# Patient Record
Sex: Female | Born: 2000 | Race: White | Hispanic: No | Marital: Single | Smoking: Never smoker
Health system: Southern US, Community
[De-identification: ages and names within clinical notes are randomized; demographics above are authoritative.]

## PROBLEM LIST (undated history)

## (undated) DIAGNOSIS — N915 Oligomenorrhea, unspecified: Secondary | ICD-10-CM

## (undated) DIAGNOSIS — J45909 Unspecified asthma, uncomplicated: Secondary | ICD-10-CM

## (undated) DIAGNOSIS — G43909 Migraine, unspecified, not intractable, without status migrainosus: Secondary | ICD-10-CM

## (undated) DIAGNOSIS — E669 Obesity, unspecified: Secondary | ICD-10-CM

## (undated) DIAGNOSIS — E282 Polycystic ovarian syndrome: Secondary | ICD-10-CM

## (undated) DIAGNOSIS — J302 Other seasonal allergic rhinitis: Secondary | ICD-10-CM

## (undated) DIAGNOSIS — M249 Joint derangement, unspecified: Secondary | ICD-10-CM

## (undated) HISTORY — DX: Migraine, unspecified, not intractable, without status migrainosus: G43.909

## (undated) HISTORY — DX: Joint derangement, unspecified: M24.9

## (undated) HISTORY — DX: Other seasonal allergic rhinitis: J30.2

## (undated) HISTORY — DX: Oligomenorrhea, unspecified: N91.5

## (undated) HISTORY — DX: Unspecified asthma, uncomplicated: J45.909

## (undated) HISTORY — DX: Obesity, unspecified: E66.9

---
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
## 2018-07-16 ENCOUNTER — Encounter (HOSPITAL_COMMUNITY): Payer: Self-pay

## 2018-11-02 ENCOUNTER — Other Ambulatory Visit: Payer: Self-pay

## 2018-12-13 ENCOUNTER — Other Ambulatory Visit: Payer: Self-pay

## 2018-12-13 ENCOUNTER — Ambulatory Visit (HOSPITAL_COMMUNITY)
Admission: EM | Admit: 2018-12-13 | Discharge: 2018-12-13 | Disposition: A | Discharge status: Home / Self Care | Payer: Medicaid Other | Attending: Family Medicine | Admitting: Family Medicine

## 2018-12-13 ENCOUNTER — Encounter (HOSPITAL_COMMUNITY): Payer: Self-pay | Admitting: Emergency Medicine

## 2018-12-13 DIAGNOSIS — M545 Low back pain, unspecified: Secondary | ICD-10-CM

## 2018-12-13 MED ORDER — CYCLOBENZAPRINE HCL 5 MG PO TABS: 5.0000 mg | ORAL_TABLET | Freq: Two times a day (BID) | ORAL | 0 refills | Status: AC | PRN

## 2018-12-13 MED ORDER — IBUPROFEN 800 MG PO TABS: 800.0000 mg | ORAL_TABLET | Freq: Three times a day (TID) | ORAL | 0 refills | Status: DC

## 2018-12-13 NOTE — ED Triage Notes (Signed)
Patient was using stair/chair today and felt right hip "tear".  Patient was using equipment going up stairs.

## 2018-12-13 NOTE — Discharge Instructions (Signed)
Use anti-inflammatories for pain/swelling. You may take up to 800 mg Ibuprofen every 8 hours with food. You may supplement Ibuprofen with Tylenol [phone] mg every 8 hours.   You may use flexeril as needed to help with pain. This is a muscle relaxer and causes sedation- please use only at bedtime or when you will be home and not have to drive/work  Alternate ice and heat  Gentle stretching- see attached  Follow up if not resolving or worsening

## 2018-12-15 NOTE — ED Provider Notes (Signed)
[facility]    CSN: [id number] Arrival date & time: 12/13/18  1834      History   Chief Complaint Chief Complaint  Patient presents with  . Hip Pain    HPI Rachel Sparks is a 18 y.o. female history of asthma, migraines, presenting today for evaluation of back/hip pain.  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  States that there was a lot of weight on her and and she was pulling the chair.  At one point she felt a pull and heard low back/gluteal pain that wrapped around to her hip.  Since she has had a lot of pain.  This accident happened earlier this morning.  She has not taken any medicines.  She has not had any radiation of pain, but did have a brief sensation of numbness in her foot.  She denies issues with urination or bowel movements.  Denies saddle anesthesia.  Denies any fall or direct blow to this area.  HPI  Past Medical History:  Diagnosis Date  . Asthma   . Hypermobile joints   . Migraines   . Obesity   . Oligomenorrhea   . Seasonal allergies     Patient Active Problem List   Diagnosis Date Noted  . Chest pain 09/19/2014  . Migraine headache 09/19/2014  . Asthma, chronic 09/19/2014  . Idiopathic scoliosis 09/19/2014  . 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 Anovulatory (dysfunctional uterine) bleeding 08/24/2012  . Obesity     Past Surgical History:  Procedure Laterality Date  . APPENDECTOMY  1/12    OB History   No obstetric history on file.      Home Medications    Prior to Admission medications   Medication Sig Start Date End Date Taking? Authorizing Provider  DULoxetine (CYMBALTA) 60 MG capsule Take 60 mg by mouth 2 (two) times daily.   Yes [provider]  medroxyPROGESTERone (DEPO-PROVERA) 150 MG/ML injection Inject 150 mg into the muscle every 3 (three) months.   Yes [provider]  montelukast (SINGULAIR) 10 MG tablet Take 10 mg by mouth at bedtime.  06/26/14  Yes  [provider]  nabumetone (RELAFEN) 750 MG tablet Take 750 mg by mouth 2 (two) times daily. 04/27/14  Yes [provider]  pantoprazole (PROTONIX) 40 MG tablet Take 40 mg by mouth daily. 05/08/17  Yes [provider]  topiramate (TOPAMAX) 100 MG tablet Take 150 mg by mouth at bedtime. 07/03/17  Yes [provider]  vitamin C (ASCORBIC ACID) 500 MG tablet Take 500 mg by mouth at bedtime.   Yes [provider]  cetirizine (ZYRTEC) 10 MG tablet Take 10 mg by mouth at bedtime.     [provider]  cyclobenzaprine (FLEXERIL) 5 MG tablet Take 1-2 tablets (5-10 mg total) by mouth 2 (two) times daily as needed for muscle spasms. 12/13/18   Kerina Simoneau C, PA-C  ibuprofen (ADVIL) 800 MG tablet Take 1 tablet (800 mg total) by mouth 3 (three) times daily. 12/13/18   Kadience Macchi C, PA-C  oxyCODONE-acetaminophen (PERCOCET) 5-325 MG tablet Take 1 tablet by mouth every 4 (four) hours as needed. 08/15/17   Julianne Rice, MD    Family History Family History  Problem Relation Age of Onset  . Hypertension Mother   . Obesity Mother   . Diabetes Mother   . Multiple sclerosis Mother   . Hypertension Maternal Grandfather   . Diabetes Maternal Grandfather   .  COPD Maternal Grandfather   . Diabetes Paternal Grandmother   . Hypertension Paternal Grandmother   . Asthma Other        great uncle  . Rheumatologic disease Neg Hx     Social History Social History   Tobacco Use  . Smoking status: Never Smoker  . Smokeless tobacco: Never Used  Substance Use Topics  . Alcohol use: No    Alcohol/week: 0.0 standard drinks  . Drug use: No     Allergies   Patient has no known allergies.   Review of Systems Review of Systems  Constitutional: Negative for fatigue and fever.  Eyes: Negative for visual disturbance.  Respiratory: Negative for shortness of breath.   Cardiovascular: Negative for chest pain.  Gastrointestinal: Negative for abdominal  pain, nausea and vomiting.  Genitourinary: Negative for decreased urine volume and difficulty urinating.  Musculoskeletal: Positive for back pain, gait problem and myalgias. Negative for arthralgias and joint swelling.  Skin: Negative for color change, rash and wound.  Neurological: Negative for dizziness, weakness, light-headedness and headaches.     Physical Exam Triage Vital Signs ED Triage Vitals  Enc Vitals Group     BP 12/13/18 1948 114/71     Pulse Rate 12/13/18 1948 85     Resp 12/13/18 1948 20     Temp 12/13/18 1948 98.7 F (37.1 C)     Temp Source 12/13/18 1948 Oral     SpO2 12/13/18 1948 100 %     Weight --      Height --      Head Circumference --      Peak Flow --      Pain Score 12/13/18 1943 8     Pain Loc --      Pain Edu? --      Excl. in GC? --    No data found.  Updated Vital Signs BP 114/71 (BP Location: Right Arm) Comment (BP Location): large cuff  Pulse 85   Temp 98.7 F (37.1 C) (Oral)   Resp 20   SpO2 100%   Visual Acuity Right Eye Distance:   Left Eye Distance:   Bilateral Distance:    Right Eye Near:   Left Eye Near:    Bilateral Near:     Physical Exam Vitals signs and nursing note reviewed.  Constitutional:      Appearance: She is well-developed.     Comments: No acute distress  HENT:     Head: Normocephalic and atraumatic.     Nose: Nose normal.  Eyes:     Conjunctiva/sclera: Conjunctivae normal.  Neck:     Musculoskeletal: Neck supple.  Cardiovascular:     Rate and Rhythm: Normal rate.  Pulmonary:     Effort: Pulmonary effort is normal. No respiratory distress.  Abdominal:     General: There is no distension.  Musculoskeletal: Normal range of motion.     Comments: Nontender to palpation of cervical, thoracic and lumbar spine midline, no palpable deformity or step-off, tenderness to palpation to lower lumbar/sacral musculature on right side wrapping around to right hip and down lateral proximal thigh, pain does not extend  into groin  Hip strength 5/5 and equal bilaterally, knee strength 5/5 and equal bilaterally, patellar reflex 2+ bilaterally  Changes position easily, gait with minimal antalgia  Skin:    General: Skin is warm and dry.  Neurological:     Mental Status: She is alert and oriented to person, place, and time.  UC Treatments / Results  Labs (all labs ordered are listed, but only abnormal results are displayed) Labs Reviewed - No data to display  EKG   Radiology No results found.  Procedures Procedures (including critical care time)  Medications Ordered in UC Medications - No data to display  Initial Impression / Assessment and Plan / UC Course  I have reviewed the triage vital signs and the nursing notes.  Pertinent labs & imaging results that were available during my care of the patient were reviewed by me and considered in my medical decision making (see chart for details).     Given mechanism of injury, most likely muscle strain of lower lumbar region, do not suspect acute bony abnormality.  Cannot rule out any tendon/ligamentous tear, but feel most likely muscle strain is most likely cause given heavy lifting.  No neuro deficits, no red flags for cauda equina.  Strength intact.  Will treat with anti-inflammatories and muscle relaxers and continue to monitor.  If symptoms not resolving or worsening for the next 1 to 2 weeks please follow-up.Discussed strict return precautions. Patient verbalized understanding and is agreeable with plan.  Final Clinical Impressions(s) / UC Diagnoses   Final diagnoses:  Acute right-sided low back pain without sciatica     Discharge Instructions     Use anti-inflammatories for pain/swelling. You may take up to 800 mg Ibuprofen every 8 hours with food. You may supplement Ibuprofen with Tylenol [phone] mg every 8 hours.   You may use flexeril as needed to help with pain. This is a muscle relaxer and causes sedation- please use only at  bedtime or when you will be home and not have to drive/work  Alternate ice and heat  Gentle stretching- see attached  Follow up if not resolving or worsening   ED Prescriptions    Medication Sig Dispense Auth. Provider   ibuprofen (ADVIL) 800 MG tablet Take 1 tablet (800 mg total) by mouth 3 (three) times daily. 21 tablet Fusaye Wachtel C, PA-C   cyclobenzaprine (FLEXERIL) 5 MG tablet Take 1-2 tablets (5-10 mg total) by mouth 2 (two) times daily as needed for muscle spasms. 24 tablet Lynn Recendiz, [place] C, PA-C     PDMP not reviewed this encounter.   Lew Dawes, PA-C 12/15/18 1524

## 2019-06-26 IMAGING — DX DG CHEST 1V PORT
1 series · 1 of 1 positions shown · non-contrast
Comparison: Chest CT 09/25/2014

CLINICAL DATA: Fall from horse.

EXAM:
PORTABLE CHEST 1 VIEW

[chest ap]
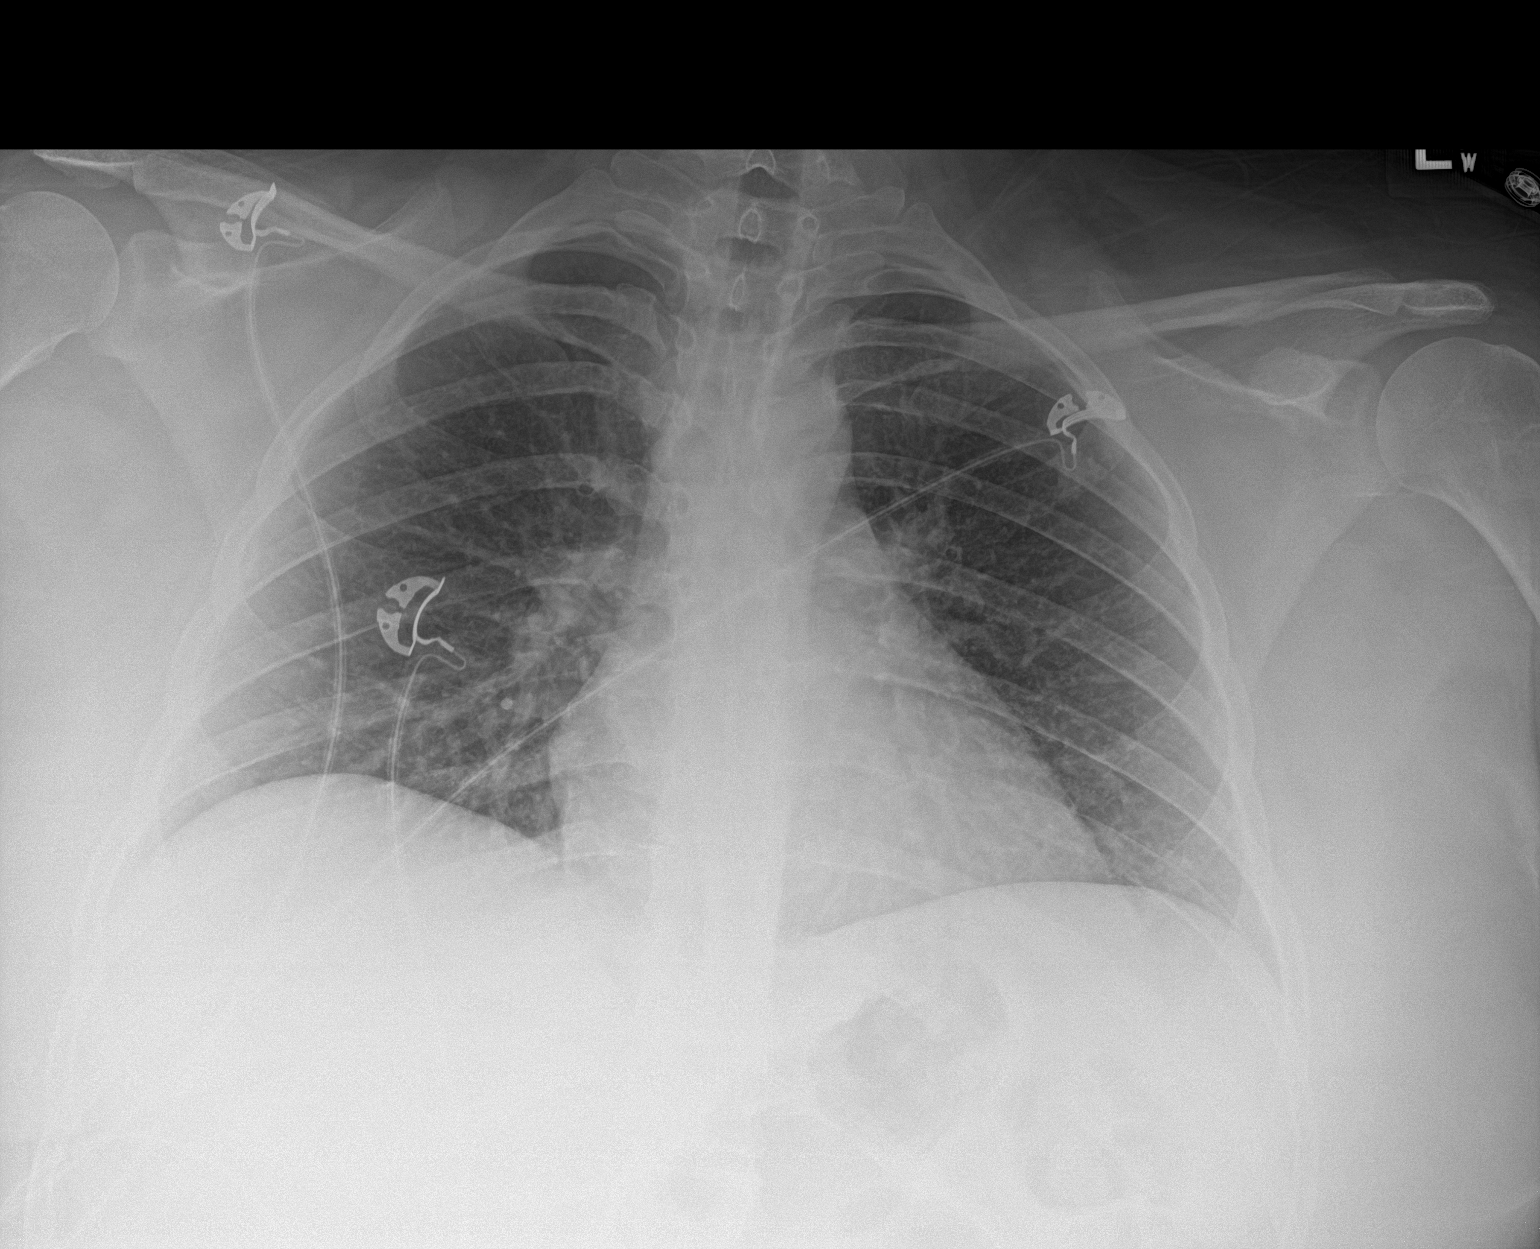

[1 of 1 positions shown; findings below may reference images not displayed]

FINDINGS: The heart size and mediastinal contours are within normal limits.
Both lungs are clear. The visualized skeletal structures are
unremarkable.
IMPRESSION: No active disease.

## 2019-06-26 IMAGING — DX DG FEMUR 2+V PORT*R*
4 series · 4 of 4 positions shown · non-contrast
Comparison: None.

CLINICAL DATA: Right hip pain.  Patient was bucked from a horse.

EXAM:
RIGHT FEMUR PORTABLE 2 VIEW

[femur ap (1 of 2)]
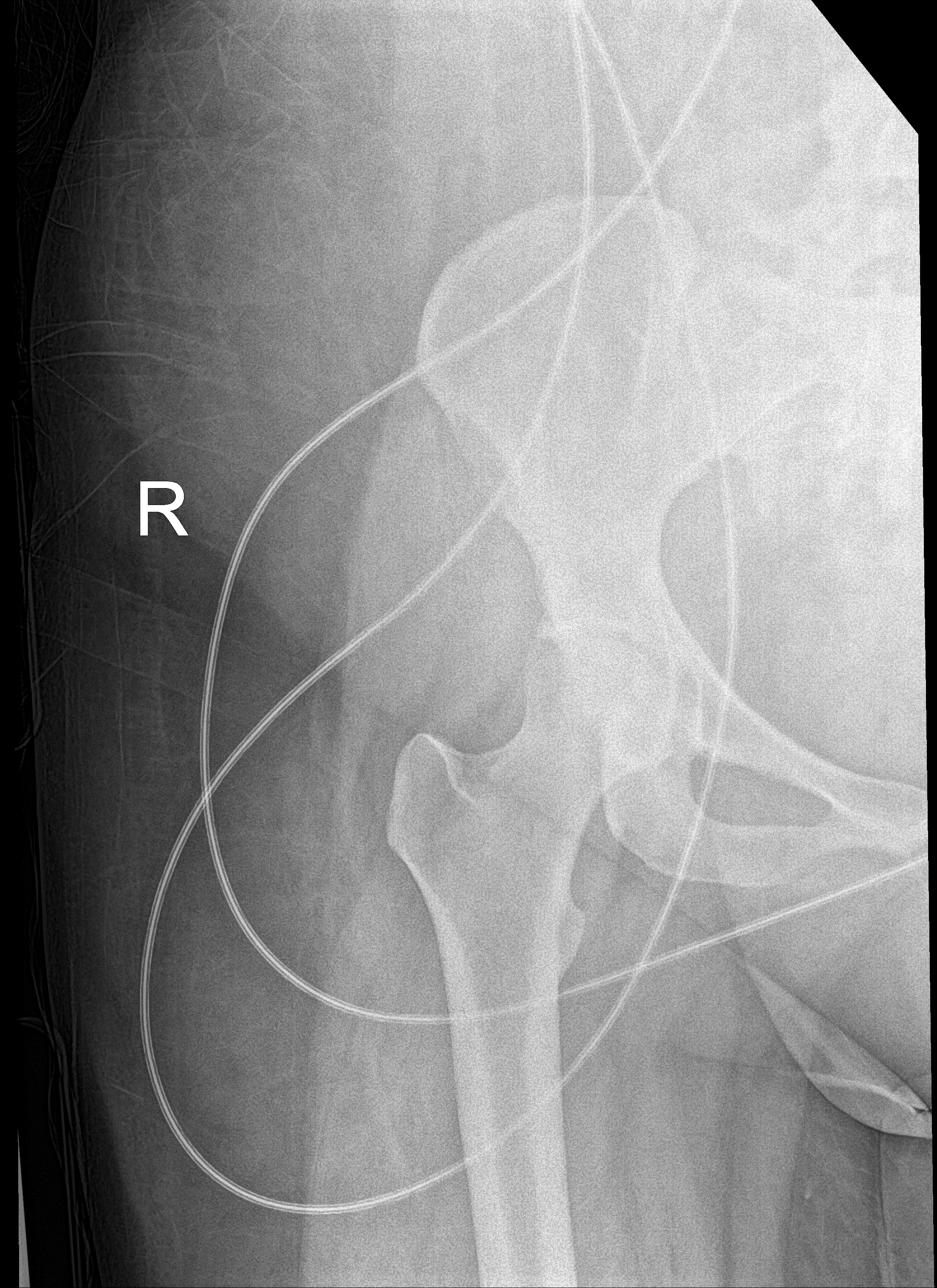

[femur ap (2 of 2)]
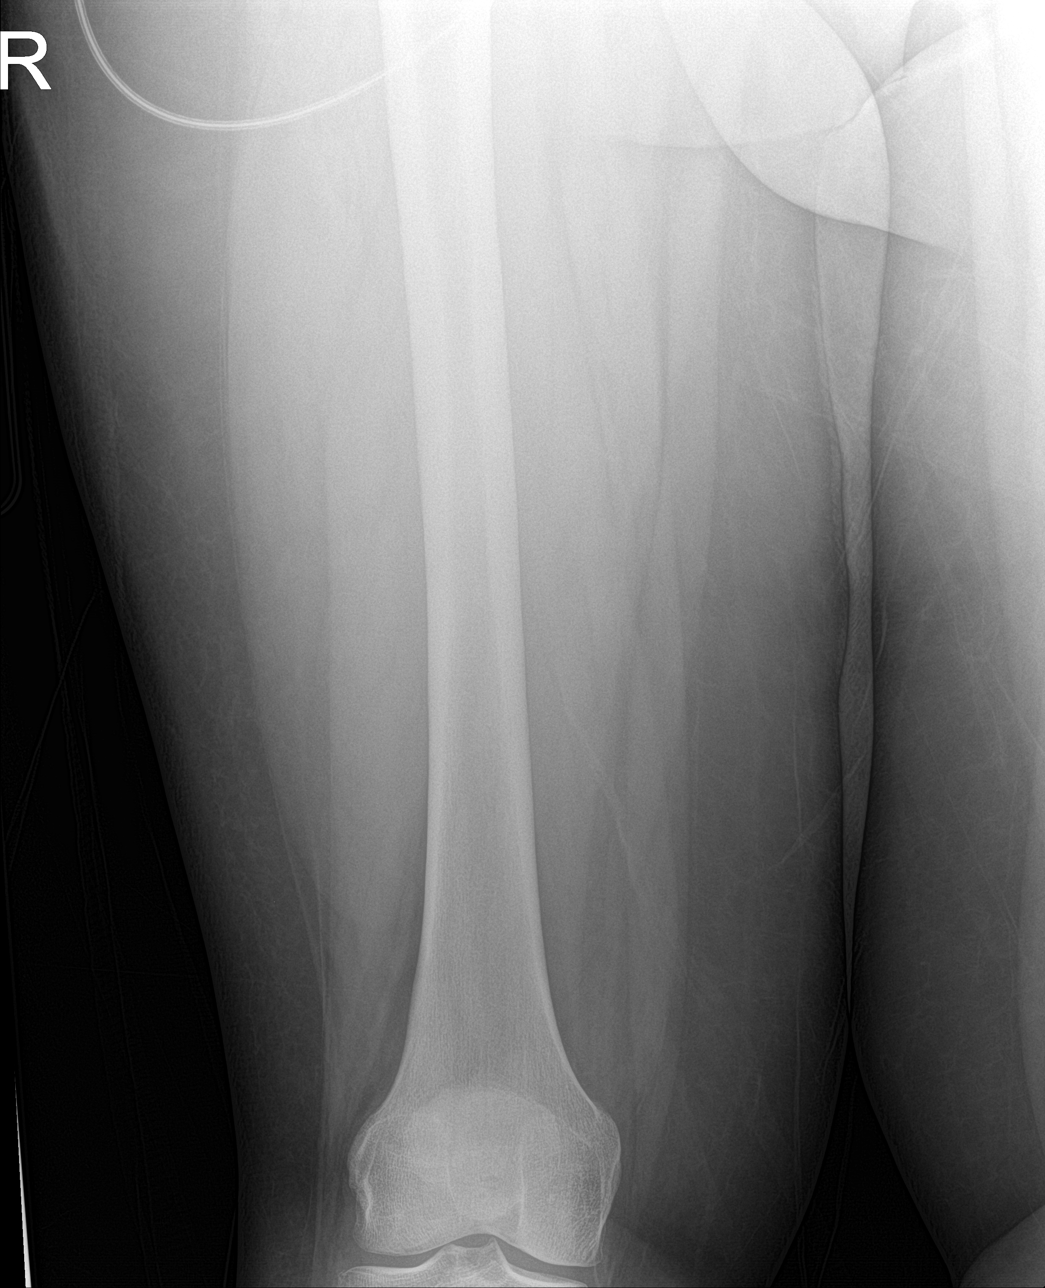

[femur lat (1 of 2)]
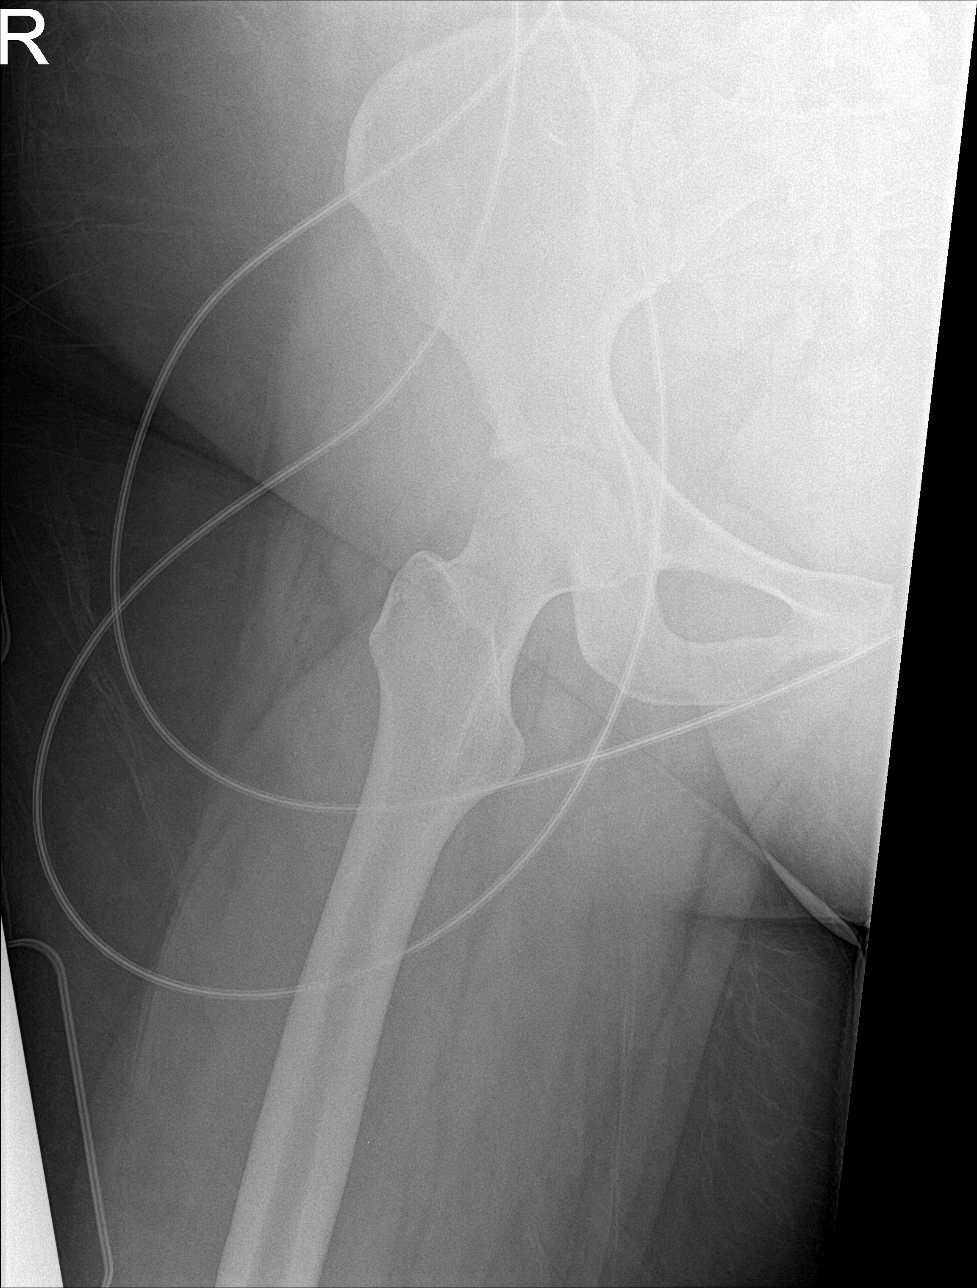

[femur lat (2 of 2)]
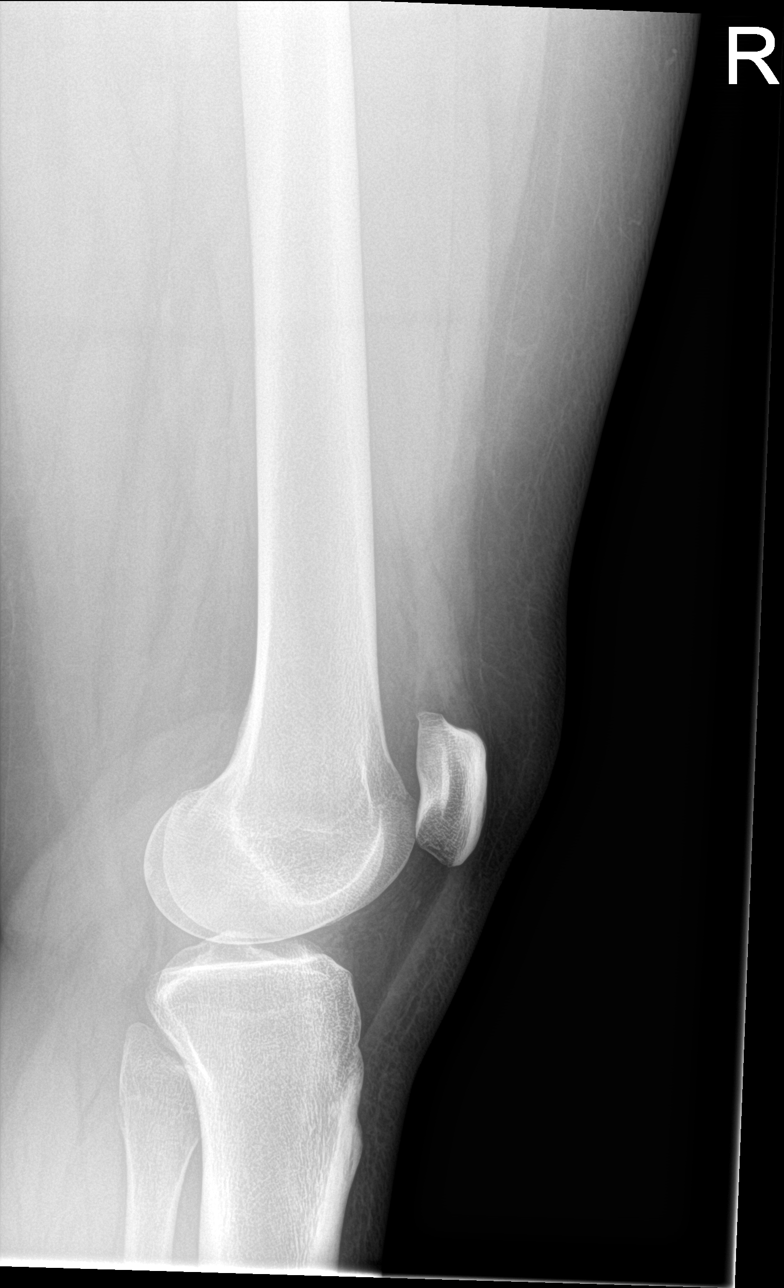

[4 of 4 positions shown; findings below may reference images not displayed]

FINDINGS: The AP view of the right hip is mildly limited by body habitus. The
mineralization and alignment are normal. There is no evidence of
acute fracture or dislocation. No evidence of femoral head avascular
necrosis. The hip and knee joint spaces appear maintained.
IMPRESSION: No evidence of acute right thigh injury.

## 2020-07-24 ENCOUNTER — Encounter (HOSPITAL_COMMUNITY): Payer: Self-pay | Admitting: Obstetrics & Gynecology

## 2020-07-24 ENCOUNTER — Other Ambulatory Visit: Payer: Self-pay

## 2020-07-24 ENCOUNTER — Inpatient Hospital Stay (HOSPITAL_COMMUNITY)
Admission: EM | Admit: 2020-07-24 | Discharge: 2020-07-24 | Disposition: A | Discharge status: Home / Self Care | Payer: BC Managed Care – PPO | Attending: Obstetrics & Gynecology | Admitting: Obstetrics & Gynecology

## 2020-07-24 DIAGNOSIS — O219 Vomiting of pregnancy, unspecified: Secondary | ICD-10-CM | POA: Diagnosis present

## 2020-07-24 DIAGNOSIS — E282 Polycystic ovarian syndrome: Secondary | ICD-10-CM | POA: Diagnosis not present

## 2020-07-24 DIAGNOSIS — O210 Mild hyperemesis gravidarum: Secondary | ICD-10-CM | POA: Diagnosis not present

## 2020-07-24 DIAGNOSIS — O99281 Endocrine, nutritional and metabolic diseases complicating pregnancy, first trimester: Secondary | ICD-10-CM | POA: Insufficient documentation

## 2020-07-24 DIAGNOSIS — Z79899 Other long term (current) drug therapy: Secondary | ICD-10-CM | POA: Insufficient documentation

## 2020-07-24 DIAGNOSIS — Z791 Long term (current) use of non-steroidal anti-inflammatories (NSAID): Secondary | ICD-10-CM | POA: Insufficient documentation

## 2020-07-24 DIAGNOSIS — Z3A01 Less than 8 weeks gestation of pregnancy: Secondary | ICD-10-CM

## 2020-07-24 DIAGNOSIS — O99211 Obesity complicating pregnancy, first trimester: Secondary | ICD-10-CM | POA: Insufficient documentation

## 2020-07-24 HISTORY — DX: Polycystic ovarian syndrome: E28.2

## 2020-07-24 LAB — CBC
HCT: 42.4 % (ref 36.0–46.0)
Hemoglobin: 14 g/dL (ref 12.0–15.0)
MCH: 29.7 pg (ref 26.0–34.0)
MCHC: 33 g/dL (ref 30.0–36.0)
MCV: 89.8 fL (ref 80.0–100.0)
Platelets: 347 10*3/uL (ref 150–400)
RBC: 4.72 MIL/uL (ref 3.87–5.11)
RDW: 13.1 % (ref 11.5–15.5)
WBC: 11.7 10*3/uL — ABNORMAL HIGH (ref 4.0–10.5)
nRBC: 0 % (ref 0.0–0.2)

## 2020-07-24 LAB — COMPREHENSIVE METABOLIC PANEL
ALT: 36 U/L (ref 0–44)
AST: 47 U/L — ABNORMAL HIGH (ref 15–41)
Albumin: 4 g/dL (ref 3.5–5.0)
Alkaline Phosphatase: 55 U/L (ref 38–126)
Anion gap: 10 (ref 5–15)
BUN: 8 mg/dL (ref 6–20)
CO2: 21 mmol/L — ABNORMAL LOW (ref 22–32)
Calcium: 9.1 mg/dL (ref 8.9–10.3)
Chloride: 103 mmol/L (ref 98–111)
Creatinine, Ser: 0.56 mg/dL (ref 0.44–1.00)
GFR, Estimated: >60 mL/min (ref >60)
Glucose, Bld: 82 mg/dL (ref 70–99)
Potassium: 4.9 mmol/L (ref 3.5–5.1)
Sodium: 134 mmol/L — ABNORMAL LOW (ref 135–145)
Total Bilirubin: 0.5 mg/dL (ref 0.3–1.2)
Total Protein: 6.8 g/dL (ref 6.5–8.1)

## 2020-07-24 LAB — URINALYSIS, ROUTINE W REFLEX MICROSCOPIC
Bilirubin Urine: NEGATIVE
Glucose, UA: NEGATIVE mg/dL
Ketones, ur: 80 mg/dL — AB
Nitrite: NEGATIVE
Protein, ur: NEGATIVE mg/dL
Specific Gravity, Urine: 1.015 (ref 1.005–1.030)
pH: 5 (ref 5.0–8.0)

## 2020-07-24 MED ORDER — M.V.I. ADULT IV INJ
Freq: Once | INTRAVENOUS | Status: AC
  Filled 2020-07-24: qty 5

## 2020-07-24 MED ORDER — PROMETHAZINE HCL 25 MG/ML IJ SOLN
25.0000 mg | Freq: Once | INTRAVENOUS | Status: AC
  Administered 2020-07-24: 25 mg via INTRAVENOUS
  Filled 2020-07-24: qty 1

## 2020-07-24 MED ORDER — DOXYLAMINE-PYRIDOXINE 10-10 MG PO TBEC: DELAYED_RELEASE_TABLET | ORAL | 0 refills | Status: AC

## 2020-07-24 MED ORDER — FAMOTIDINE IN NACL 20-0.9 MG/50ML-% IV SOLN
20.0000 mg | Freq: Once | INTRAVENOUS | Status: AC
  Administered 2020-07-24: 20 mg via INTRAVENOUS
  Filled 2020-07-24: qty 50

## 2020-07-24 MED ORDER — PANTOPRAZOLE SODIUM 40 MG PO TBEC: 40.0000 mg | DELAYED_RELEASE_TABLET | Freq: Every day | ORAL | 0 refills | Status: AC

## 2020-07-24 NOTE — MAU Provider Note (Addendum)
[facility]     CSN: [id number]  Arrival date and time: 07/24/20 1522   Event Date/Time   First Provider Initiated Contact with Patient 07/24/20 1702      Chief Complaint  Patient presents with   Nausea   Emesis   Abdominal Pain   HPI Rachel Sparks is a 20 y.o. G1P0 at [redacted]w[redacted]d who presents to MAU with chief complaint of recurrent emesis. This is a new problem, onset last night. Patient estimates she has vomited 6-10 times today. Patient is s/p New OB appointment yesterday 07/23/20. She has updated prescriptions for Phenergan and Zofran but they trigger her vomiting. Diet recall today includes pretzel goldfish crackers and large amounts of water. She denies dysuria,vaginal bleeding, fever or recent illness.  Patient has [facility] with [facility].  OB History     Gravida  1   Para      Term      Preterm      AB      Living         SAB      IAB      Ectopic      Multiple      Live Births              Past Medical History:  Diagnosis Date   Asthma    childhood   Hypermobile joints    Migraines    Obesity    Oligomenorrhea    PCOS (polycystic ovarian syndrome)    Seasonal allergies     Past Surgical History:  Procedure Laterality Date   APPENDECTOMY  1/12    Family History  Problem Relation Age of Onset   Hypertension Mother    Obesity Mother    Diabetes Mother    Multiple sclerosis Mother    Other Maternal Grandmother        ospoclonus myoclonus syndrome   Hypertension Maternal Grandfather    Diabetes Maternal Grandfather    COPD Maternal Grandfather    Diabetes Paternal Grandmother    Hypertension Paternal Grandmother    Asthma Other        great uncle   Rheumatologic disease Neg Hx     Social History   Tobacco Use   Smoking status: Never   Smokeless tobacco: Never  Substance Use Topics   Alcohol use: No    Alcohol/week: 0.0 standard drinks   Drug use: No    Allergies: No Known Allergies  Medications Prior to Admission   Medication Sig Dispense Refill Last Dose   cetirizine (ZYRTEC) 10 MG tablet Take 10 mg by mouth at bedtime.       cyclobenzaprine (FLEXERIL) 5 MG tablet Take 1-2 tablets (5-10 mg total) by mouth 2 (two) times daily as needed for muscle spasms. 24 tablet 0    DULoxetine (CYMBALTA) 60 MG capsule Take 60 mg by mouth 2 (two) times daily.      ibuprofen (ADVIL) 800 MG tablet Take 1 tablet (800 mg total) by mouth 3 (three) times daily. 21 tablet 0    medroxyPROGESTERone (DEPO-PROVERA) 150 MG/ML injection Inject 150 mg into the muscle every 3 (three) months.      montelukast (SINGULAIR) 10 MG tablet Take 10 mg by mouth at bedtime.   2    nabumetone (RELAFEN) 750 MG tablet Take 750 mg by mouth 2 (two) times daily.      oxyCODONE-acetaminophen (PERCOCET) 5-325 MG tablet Take 1 tablet by mouth every 4 (four) hours as needed. 15 tablet 0  pantoprazole (PROTONIX) 40 MG tablet Take 40 mg by mouth daily.  2    topiramate (TOPAMAX) 100 MG tablet Take 150 mg by mouth at bedtime.  3    vitamin C (ASCORBIC ACID) 500 MG tablet Take 500 mg by mouth at bedtime.       Review of Systems  Constitutional:  Positive for fatigue.  Gastrointestinal:  Positive for nausea and vomiting.  All other systems reviewed and are negative. Physical Exam   Blood pressure 127/75, pulse 81, temperature 98.8 F (37.1 C), temperature source Oral, resp. rate 16, height 5\' 5"  (1.651 m), weight 121 kg, SpO2 100 %.  Physical Exam Constitutional:      Appearance: She is obese. She is ill-appearing.  Cardiovascular:     Rate and Rhythm: Normal rate.     Heart sounds: Normal heart sounds.  Pulmonary:     Effort: Pulmonary effort is normal.     Breath sounds: Normal breath sounds.  Skin:    General: Skin is warm and dry.     Capillary Refill: Capillary refill takes less than 2 seconds.  Neurological:     Mental Status: She is oriented to person, place, and time.  Psychiatric:        Mood and Affect: Mood normal.         Behavior: Behavior normal.    MAU Course  Procedures  Orders Placed This Encounter  Procedures   Urinalysis, Routine w reflex microscopic Urine, Clean Catch   CBC   Comprehensive metabolic panel   Measure blood pressure   Patient Vitals for the past 24 hrs:  BP Temp Temp src Pulse Resp SpO2 Height Weight  07/24/20 1639 127/75 98.8 F (37.1 C) Oral 81 16 100 % -- --  07/24/20 1635 -- -- -- -- -- -- 5\' 5"  (1.651 m) 121 kg  07/24/20 1535 134/76 98.6 F (37 C) Oral 86 18 100 % -- --   Results for orders placed or performed during the hospital encounter of 07/24/20 (from the past 24 hour(s))  Urinalysis, Routine w reflex microscopic Urine, Clean Catch     Status: Abnormal   Collection Time: 07/24/20  4:55 PM  Result Value Ref Range   Color, Urine YELLOW YELLOW   APPearance HAZY (A) CLEAR   Specific Gravity, Urine 1.015 1.005 - 1.030   pH 5.0 5.0 - 8.0   Glucose, UA NEGATIVE NEGATIVE mg/dL   Hgb urine dipstick SMALL (A) NEGATIVE   Bilirubin Urine NEGATIVE NEGATIVE   Ketones, ur 80 (A) NEGATIVE mg/dL   Protein, ur NEGATIVE NEGATIVE mg/dL   Nitrite NEGATIVE NEGATIVE   Leukocytes,Ua MODERATE (A) NEGATIVE   RBC / HPF 0-5 0 - 5 RBC/hpf   WBC, UA 6-10 0 - 5 WBC/hpf   Bacteria, UA RARE (A) NONE SEEN   Squamous Epithelial / LPF 6-10 0 - 5   Mucus PRESENT   CBC     Status: Abnormal   Collection Time: 07/24/20  7:06 PM  Result Value Ref Range   WBC 11.7 (H) 4.0 - 10.5 K/uL   RBC 4.72 3.87 - 5.11 MIL/uL   Hemoglobin 14.0 12.0 - 15.0 g/dL   HCT 07/26/20 07/26/20 - 24.2 %   MCV 89.8 80.0 - 100.0 fL   MCH 29.7 26.0 - 34.0 pg   MCHC 33.0 30.0 - 36.0 g/dL   RDW 35.3 61.4 - 43.1 %   Platelets 347 150 - 400 K/uL   nRBC 0.0 0.0 - 0.2 %  Comprehensive metabolic  panel     Status: Abnormal   Collection Time: 07/24/20  7:06 PM  Result Value Ref Range   Sodium 134 (L) 135 - 145 mmol/L   Potassium 4.9 3.5 - 5.1 mmol/L   Chloride 103 98 - 111 mmol/L   CO2 21 (L) 22 - 32 mmol/L   Glucose, Bld 82  70 - 99 mg/dL   BUN 8 6 - 20 mg/dL   Creatinine, Ser 4.82 0.44 - 1.00 mg/dL   Calcium 9.1 8.9 - 50.0 mg/dL   Total Protein 6.8 6.5 - 8.1 g/dL   Albumin 4.0 3.5 - 5.0 g/dL   AST 47 (H) 15 - 41 U/L   ALT 36 0 - 44 U/L   Alkaline Phosphatase 55 38 - 126 U/L   Total Bilirubin 0.5 0.3 - 1.2 mg/dL   GFR, Estimated >37 >04 mL/min   Anion gap 10 5 - 15   Report given to E. Lyman Bishop, NP who assumes care of patient at this time.  Clayton Bibles, MSN, CNM Certified Nurse Midwife, [facility] for [employer], [facility] 07/24/20 8:08 PM   Patient reports improvement in symptoms after IV fluids & meds. Will d/c home with rx antiemetics. Pt states she already has phenergan at home.  Assessment and Plan   1. Nausea and vomiting during pregnancy prior to [redacted] weeks gestation   2. [redacted] weeks gestation of pregnancy    -rx diclegis & protonix -continue phenergan prn -reviewed reasons to return to MAU

## 2020-07-24 NOTE — MAU Note (Signed)
Rachel Sparks is a 20 y.o. at [redacted]w[redacted]d here in MAU reporting: constant nausea and vomiting for the past 24 hours. 5-6 episodes of vomiting today. Took phenergan at 1400, last zofran was last night. Having some abdominal pain.  Onset of complaint: yesterday  Pain score: 3/10  Vitals:   07/24/20 1535 07/24/20 1639  BP: 134/76 127/75  Pulse: 86 81  Resp: 18 16  Temp: 98.6 F (37 C) 98.8 F (37.1 C)  SpO2: 100% 100%   Lab orders placed from triage: UA

## 2020-07-24 NOTE — ED Provider Notes (Signed)
Emergency Medicine Provider Triage Evaluation Note  Rachel Sparks [facility] , a 20 y.o. female  was evaluated in triage.  Pt complains of intractable nausea and vomiting refractory to Zofran, Phenergan, intermittently at home x24 hours in context of pregnancy.  Patient is adamant pregnant.  OB/GYN is in the [place] system, clot from 6/15 is 14,255.  Denies fevers, chills, abdominal pain, vaginal bleeding or discharge, visible and caregiver.  Review of Systems  Positive: Intractable nausea and vomiting x24 hours in pregnancy Negative: Vaginal bleeding, discharge, fevers, chills, abdominal pain  Physical Exam  BP 134/76   Pulse 86   Temp 98.6 F (37 C) (Oral)   Resp 18   SpO2 100%  Gen:   Awake, no distress   Resp:  Normal effort  MSK:   Moves extremities without difficulty  Other:  RRR, no M/R/G.  Abdomen soft, nondistended, nontender.  Medical Decision Making  Medically screening exam initiated at 3:58 PM.  Appropriate orders placed.  Rachel Sparks was informed that the remainder of the evaluation will be completed by another provider, this initial triage assessment does not replace that evaluation, and the importance of remaining in the ED until their evaluation is complete.  Patient discussed with MAU APP, [place], who is agreeable to receiving this patient in their unit.  I appreciate her collaboration in the care of this patient.  Patient is hemodynamically stable and safe for transport to MAU at this time.  This chart was dictated using voice recognition software, Dragon. Despite the best efforts of this provider to proofread and correct errors, errors may still occur which can change documentation meaning.    Sherrilee Gilles 07/24/20 1600    Eber Hong, MD 07/25/20 1030

## 2023-03-23 ENCOUNTER — Other Ambulatory Visit: Payer: Self-pay | Admitting: Medical Genetics

## 2023-04-10 ENCOUNTER — Other Ambulatory Visit (HOSPITAL_COMMUNITY)
Admission: RE | Admit: 2023-04-10 | Discharge: 2023-04-10 | Disposition: A | Discharge status: Home / Self Care | Payer: Self-pay | Source: Ambulatory Visit | Attending: Medical Genetics | Admitting: Medical Genetics

## 2023-04-24 LAB — GENECONNECT MOLECULAR SCREEN: Genetic Analysis Overall Interpretation: NEGATIVE
